# Patient Record
Sex: Male | Born: 1955 | Race: White | Hispanic: No | Marital: Married | Smoking: Never smoker
Health system: Southern US, Community
[De-identification: ages and names within clinical notes are randomized; demographics above are authoritative.]

## PROBLEM LIST (undated history)

## (undated) DIAGNOSIS — E785 Hyperlipidemia, unspecified: Secondary | ICD-10-CM

## (undated) DIAGNOSIS — I10 Essential (primary) hypertension: Secondary | ICD-10-CM

## (undated) DIAGNOSIS — I8390 Asymptomatic varicose veins of unspecified lower extremity: Secondary | ICD-10-CM

## (undated) HISTORY — PX: VARICOSE VEIN SURGERY: SHX832

## (undated) HISTORY — DX: Asymptomatic varicose veins of unspecified lower extremity: I83.90

## (undated) HISTORY — DX: Hyperlipidemia, unspecified: E78.5

## (undated) HISTORY — DX: Essential (primary) hypertension: I10

## (undated) HISTORY — PX: OTHER SURGICAL HISTORY: SHX169

---

## 2012-07-25 ENCOUNTER — Other Ambulatory Visit (HOSPITAL_COMMUNITY): Payer: Self-pay | Admitting: Internal Medicine

## 2012-07-25 ENCOUNTER — Ambulatory Visit (HOSPITAL_COMMUNITY)
Admission: RE | Admit: 2012-07-25 | Discharge: 2012-07-25 | Disposition: A | Discharge status: Home / Self Care | Payer: BC Managed Care – PPO | Source: Ambulatory Visit | Attending: Internal Medicine | Admitting: Internal Medicine

## 2012-07-25 DIAGNOSIS — I82819 Embolism and thrombosis of superficial veins of unspecified lower extremities: Secondary | ICD-10-CM | POA: Insufficient documentation

## 2012-07-25 DIAGNOSIS — I8310 Varicose veins of unspecified lower extremity with inflammation: Secondary | ICD-10-CM

## 2012-07-25 DIAGNOSIS — M79609 Pain in unspecified limb: Secondary | ICD-10-CM

## 2012-08-01 ENCOUNTER — Other Ambulatory Visit: Payer: Self-pay | Admitting: *Deleted

## 2012-08-01 DIAGNOSIS — I83893 Varicose veins of bilateral lower extremities with other complications: Secondary | ICD-10-CM

## 2012-08-09 ENCOUNTER — Encounter: Payer: Self-pay | Admitting: Vascular Surgery

## 2012-08-12 ENCOUNTER — Encounter (INDEPENDENT_AMBULATORY_CARE_PROVIDER_SITE_OTHER): Payer: BC Managed Care – PPO | Admitting: *Deleted

## 2012-08-12 ENCOUNTER — Ambulatory Visit (INDEPENDENT_AMBULATORY_CARE_PROVIDER_SITE_OTHER): Payer: BC Managed Care – PPO | Admitting: Vascular Surgery

## 2012-08-12 ENCOUNTER — Encounter: Payer: Self-pay | Admitting: Vascular Surgery

## 2012-08-12 VITALS — BP 160/98 | HR 61 | Resp 20 | Ht 69.0 in | Wt 180.0 lb

## 2012-08-12 DIAGNOSIS — I83893 Varicose veins of bilateral lower extremities with other complications: Secondary | ICD-10-CM | POA: Insufficient documentation

## 2012-08-12 DIAGNOSIS — I8000 Phlebitis and thrombophlebitis of superficial vessels of unspecified lower extremity: Secondary | ICD-10-CM | POA: Insufficient documentation

## 2012-08-12 NOTE — Progress Notes (Signed)
Subjective:     Patient ID: Miguel Lyons, male   DOB: Sep 19, 1955, 56 y.o.   MRN: [id number]  HPI this  55 year old male was referred for severe venous insufficiency of both lower extremities and a recent onset of thrombophlebitis in the right leg. Patient had distal stripping of the right GSB performed in 1994 in [place]. He developed recurrent varicosities shortly thereafter. In the late 1990s he episode of thrombophlebitis in the right pretibial region. 2 months ago he had another episode of thrombophlebitis in the medial calf on the right and shortly thereafter an episode of thrombophlebitis in the distal thigh on the right. He has not been treated with anticoagulants. He has no history of DVT. 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 He has no history of stasis ulcers or bleeding. He did wear a long leg elastic compression stockings for her 5 year period with some improvement in symptoms but no improvement in the varicosities but this was many years ago. 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 This is affecting his ability to work and is everyday living.  Past Medical History  Diagnosis Date  . Varicose veins     History  Substance Use Topics  . Smoking status: Former Smoker    Types: Cigarettes    Quit date: 08/13/1991  . Smokeless tobacco: Never Used  . Alcohol Use: Yes    Family History  Problem Relation Age of Onset  . Other Mother     varicose veins  . Cancer Father     No Known Allergies  Current outpatient prescriptions:aspirin 81 MG tablet, Take 81 mg by mouth daily., Disp: , Rfl:   BP 160/98  Pulse 61  Resp 20  Ht 5\' 9"  (1.753 m)  Wt 180 lb (81.647 kg)  BMI 26.58 kg/m2  Body mass index is 26.58 kg/(m^2).        Review of Systems denies chest pain, dyspnea on exertion, PND, orthopnea, claudication,  wheezing,-all systems are negative and complete review of    Objective:   Physical Exam blood pressure 160/98 heart rate 61 respirations 20 Gen.-alert and oriented x3 in no apparent distress HEENT normal for age Lungs no rhonchi or wheezing Cardiovascular regular rhythm no murmurs carotid pulses 3+ palpable no bruits audible Abdomen soft nontender no palpable masses Musculoskeletal free of  major deformities Skin clear -no rashes Neurologic normal Lower extremities 3+ femoral and dorsalis pedis pulses palpable bilaterally with 1+ edema bilaterally Right leg with 3 episodes of thrombophlebitis. Distal thigh has a large varix which is thrombosed measuring 3-4 cm in diameter. There is also an area in the medial calf over the great saphenous system measuring about 1-1/2-2 cm in diameter with thrombus. There is old chronic area of thrombophlebitis in the right pretibial region just below the knee over a 3-4 cm diameter. In addition there are multiple bulging varicosities throughout the great saphenous system and small saphenous system on the right extending down into the ankle around the medial malleolus with early hyperpigmentation. Left leg has extensive bulging varicosities beginning in the proximal anterior thigh extending down into the distal thigh and medial calf and into the medial malleolar area with early hyperpigmentation but no active ulcers.  Today I ordered bilateral venous duplex exam which I reviewed and interpreted. Right leg has gross reflux throughout right great saphenous system from the saphenofemoral junction to the  distal thigh and right small saphenous system both supplying these bulging varicosities. Left leg has gross reflux throughout great saphenous system an anterior accessory branch of left great saphenous system supplying these bulging varicosities. X line there is no DVT in either leg.       Assessment:     Severe bilateral venous insufficiency with gross reflux in  multiple superficial saphenous veins-right great and small and left great and anterior accessory branch of left great saphenous vein Multiple episodes of thrombophlebitis right leg secondary to above Pain and swelling in both lower extremities affecting the patient's ability to work and daily living    Plan:     #1 long-leg elastic compression stockings 20-30 mm gradient #2 elevate legs as much as job will allow #3 ibuprofen on a daily basis #4 if patient does not have dramatic change he needs #1 laser ablation right great saphenous vein followed by #2 laser ablation right small saphenous vein with greater than 20 stab phlebectomy followed by #3 laser ablation of left great saphenous and anterior accessory branch left great saphenous with greater than 20 stab phlebectomy as single procedure   patient will return in 3 months for further follow

## 2012-08-26 ENCOUNTER — Encounter: Payer: BC Managed Care – PPO | Admitting: Vascular Surgery

## 2012-11-08 ENCOUNTER — Encounter: Payer: Self-pay | Admitting: Vascular Surgery

## 2012-11-11 ENCOUNTER — Ambulatory Visit (INDEPENDENT_AMBULATORY_CARE_PROVIDER_SITE_OTHER): Payer: BC Managed Care – PPO | Admitting: Vascular Surgery

## 2012-11-11 ENCOUNTER — Encounter: Payer: Self-pay | Admitting: Vascular Surgery

## 2012-11-11 VITALS — BP 150/87 | HR 64 | Resp 20 | Ht 69.5 in | Wt 184.0 lb

## 2012-11-11 DIAGNOSIS — I83893 Varicose veins of bilateral lower extremities with other complications: Secondary | ICD-10-CM

## 2012-11-11 NOTE — Progress Notes (Signed)
Subjective:     Patient ID: Miguel Lyons, male   DOB: 1955/11/02, 57 y.o.   MRN: [id number]  HPI this 57 year old male returns for continued followup regarding severe varicose veins both lower extremities. He has a history thrombophlebitis in the right lower extremity on 3 occasions. He had a vein stripping done in the 1990s on the right leg and shortly thereafter began developing recurrent varicosities. He has pain aching throbbing and burning discomfort which is not relieved by long-leg elastic compression stockings 20-30 mm gradient, elevation, and ibuprofen.  Past Medical History  Diagnosis Date  . Varicose veins     History  Substance Use Topics  . Smoking status: Former Smoker    Types: Cigarettes    Quit date: 08/13/1991  . Smokeless tobacco: Never Used  . Alcohol Use: Yes    Family History  Problem Relation Age of Onset  . Other Mother     varicose veins  . Cancer Father     No Known Allergies  Current outpatient prescriptions:aspirin 81 MG tablet, Take 81 mg by mouth daily., Disp: , Rfl:   BP 150/87  Pulse 64  Resp 20  Ht 5' 9.5" (1.765 m)  Wt 184 lb (83.462 kg)  BMI 26.79 kg/m2  Body mass index is 26.79 kg/(m^2).         Review of Systems denies chest pain, dyspnea on exertion, PND, orthopnea, hemoptysis, claudication.    Objective:   Physical Exam blood pressure 150/87 heart rate 64 respirations 20 General well-developed well-nourished male in no apparent stress alert and oriented x3 Lungs no rhonchi or wheezing Right lower extremity with extensive large bulging varicosities beginning in the medial thigh extending into the medial and posterior calf with 1+ distal edema and some old thrombophlebitis in the right pretibial region. Left leg has bulging varicosities beginning in the proximal third of the thigh extending from the anterior excess Roux branch down the anterior thigh into the pretibial region with 1+ edema. Both legs have 2+ dorsalis pedis  pulses palpable.    Assessment:     Severe symptomatic large bulging varicose veins both lower extremities with history thrombophlebitis right leg on 3 occasions-symptoms not relieved by conservative measures-affecting patient's daily living Patient has documented gross reflux in right great saphenous vein, right small saphenous vein supplying these bulging varicosities. On the left leg patient has gross reflux in the great saphenous and anterior accessory branch of the saphenous which is supplying these large symptomatic varicosities in the thigh and pretibial region To alleviate the problem the left the patient will need laser ablation of both the great saphenous and anterior excess rib branch plus greater than 20 stab phlebectomy    Plan:    patient needs #1 laser ablation right great saphenous vein followed by #2 laser ablation right small saphenous vein plus gradient and 20 stab phlebectomy #3 laser ablation of left great saphenous vein and anterior accessory saphenous vein both of which have reflux in our supplying bulging varicosities. It should be done as a single procedure accompanied by greater than 20 stab phlebectomy. Laser ablation on the left only the great saphenous vein without treating the anterior excess Roux branch will not relieve the problem since the patient has bulging varicosities supplied by both  We'll proceed with precertification to perform this in the near future

## 2012-11-20 ENCOUNTER — Other Ambulatory Visit: Payer: Self-pay | Admitting: *Deleted

## 2012-11-20 DIAGNOSIS — I83893 Varicose veins of bilateral lower extremities with other complications: Secondary | ICD-10-CM

## 2012-12-06 ENCOUNTER — Encounter: Payer: Self-pay | Admitting: Vascular Surgery

## 2012-12-09 ENCOUNTER — Encounter: Payer: Self-pay | Admitting: Vascular Surgery

## 2012-12-09 ENCOUNTER — Ambulatory Visit (INDEPENDENT_AMBULATORY_CARE_PROVIDER_SITE_OTHER): Payer: BC Managed Care – PPO | Admitting: Vascular Surgery

## 2012-12-09 VITALS — BP 146/95 | HR 65 | Resp 16 | Ht 69.5 in | Wt 180.0 lb

## 2012-12-09 DIAGNOSIS — I83893 Varicose veins of bilateral lower extremities with other complications: Secondary | ICD-10-CM

## 2012-12-09 NOTE — Progress Notes (Signed)
Laser Ablation Procedure      Date: 12/09/2012    Miguel Lyons DOB:[birth year]  Consent signed: Yes  Surgeon:J.D. Hart [place]  Procedure: Laser Ablation: right Greater Saphenous Vein  BP 146/95  Pulse 65  Resp 16  Ht 5' 9.5" (1.765 m)  Wt 180 lb (81.647 kg)  BMI 26.21 kg/m2  Start time: 9:30   End time: 9:50  Tumescent Anesthesia: 400 cc 0.9% NaCl with 50 cc Lidocaine HCL with 1% Epi and 15 cc 8.4% NaHCO3  Local Anesthesia: 6 cc Lidocaine HCL and NaHCO3 (ratio 2:1)  Pulsed mode:yes Watts 15 Seconds 1 Pulses:1 Total Pulses:116 Total Energy: 1740 Total Time: 1:56     Patient tolerated procedure well: Yes  Notes:   Description of Procedure:  After marking the course of the saphenous vein and the secondary varicosities in the standing position, the patient was placed on the operating table in the supine position, and the right leg was prepped and draped in sterile fashion. Local anesthetic was administered, and under ultrasound guidance the saphenous vein was accessed with a micro needle and guide wire; then the micro puncture sheath was placed. A guide wire was inserted to the saphenofemoral junction, followed by a 5 french sheath.  The position of the sheath and then the laser fiber below the junction was confirmed using the ultrasound and visualization of the aiming beam.  Tumescent anesthesia was administered along the course of the saphenous vein using ultrasound guidance. Protective laser glasses were placed on the patient, and the laser was fired at 15 watt pulsed mode advancing 1-2 mm per sec.  For a total of 1740 joules.  A steri strip was applied to the puncture site.    ABD pads and thigh high compression stockings were applied.  Ace wrap bandages were applied over the phlebectomy sites and at the top of the saphenofemoral junction.  Blood loss was less than 15 cc.  The patient ambulated out of the operating room having tolerated the procedure well.

## 2012-12-09 NOTE — Progress Notes (Signed)
Subjective:     Patient ID: Miguel Lyons, male   DOB: [birth year], 57 y.o.   MRN: [id number]  HPI this 57 year old male laser ablation right great saphenous vein performed under local tumescent anesthesia for venous hypertension with painful varicosities. He tolerated the procedure well. Total of 1740 J of energy was utilized.   Review of Systems     Objective:   Physical Exam BP 146/95  Pulse 65  Resp 16  Ht 5' 9.5" (1.765 m)  Wt 180 lb (81.647 kg)  BMI 26.21 kg/m2        Assessment:     Well-tolerated laser ablation right great saphenous vein performed under local tumescent anesthesia    Plan:     Return in one week for venous duplex exam to confirm closure right great saphenous vein

## 2012-12-10 ENCOUNTER — Telehealth: Payer: Self-pay | Admitting: *Deleted

## 2012-12-10 NOTE — Telephone Encounter (Signed)
Is having discomfort but following all instructions. Reminded him of his fu appt.

## 2012-12-13 ENCOUNTER — Encounter: Payer: Self-pay | Admitting: Vascular Surgery

## 2012-12-16 ENCOUNTER — Ambulatory Visit (INDEPENDENT_AMBULATORY_CARE_PROVIDER_SITE_OTHER): Payer: BC Managed Care – PPO | Admitting: Vascular Surgery

## 2012-12-16 ENCOUNTER — Encounter (INDEPENDENT_AMBULATORY_CARE_PROVIDER_SITE_OTHER): Payer: BC Managed Care – PPO | Admitting: *Deleted

## 2012-12-16 ENCOUNTER — Encounter: Payer: Self-pay | Admitting: Vascular Surgery

## 2012-12-16 ENCOUNTER — Other Ambulatory Visit: Payer: Self-pay | Admitting: *Deleted

## 2012-12-16 VITALS — BP 159/98 | HR 54 | Resp 18 | Ht 69.5 in | Wt 180.0 lb

## 2012-12-16 DIAGNOSIS — I83893 Varicose veins of bilateral lower extremities with other complications: Secondary | ICD-10-CM

## 2012-12-16 NOTE — Progress Notes (Signed)
Subjective:     Patient ID: Miguel Lyons, male   DOB: 12/12/1955, 57 y.o.   MRN: [id number]  HPI this 57 year old male returns 1 week post laser ablation right great saphenous vein for pain varicosities. He tolerated the procedure well. He states that he has had decrease in the ankle swelling. He has had chest pain hemoptysis or dyspnea on exertion. Left leg is unchanged continuing to have aching throbbing and burning discomfort despite long-leg elastic compression stockings.  Past Medical History  Diagnosis Date  . Varicose veins     History  Substance Use Topics  . Smoking status: Former Smoker    Types: Cigarettes    Quit date: 08/13/1991  . Smokeless tobacco: Never Used  . Alcohol Use: Yes    Family History  Problem Relation Age of Onset  . Other Mother     varicose veins  . Cancer Father     No Known Allergies  Current outpatient prescriptions:aspirin 81 MG tablet, Take 81 mg by mouth daily., Disp: , Rfl:   BP 159/98  Pulse 54  Resp 18  Ht 5' 9.5" (1.765 m)  Wt 180 lb (81.647 kg)  BMI 26.21 kg/m2  Body mass index is 26.21 kg/(m^2).        Review of Systems see history of present illness  no chest pain, dyspnea on exertion, PND, orthopnea    Objective:   Physical Exam blood pressure 20/200 98 heart rate 54 respirations 18 General well-developed well-nourished male in no apparent stress alert and oriented x3 Right leg with mild tenderness along course of great saphenous vein from saphenofemoral junction to distal thigh. 1+ distal edema. 3 posterior cells pedis pulse palpable. Lungs no rhonchi or wheezing  To have ordered a venous duplex exam of the right leg which I reviewed and interpreted. There is no DVT. His total closure of the right great saphenous vein from the distal thigh up to the saphenofemoral junction.     Assessment:     Successful laser ablation right great saphenous vein    Plan:    Return next week for laser ablation right small  saphenous vein with greater than 20 stab phlebectomy

## 2012-12-20 ENCOUNTER — Encounter: Payer: Self-pay | Admitting: Vascular Surgery

## 2012-12-23 ENCOUNTER — Encounter: Payer: Self-pay | Admitting: Vascular Surgery

## 2012-12-23 ENCOUNTER — Ambulatory Visit (INDEPENDENT_AMBULATORY_CARE_PROVIDER_SITE_OTHER): Payer: BC Managed Care – PPO | Admitting: Vascular Surgery

## 2012-12-23 VITALS — BP 151/96 | HR 57 | Resp 16 | Ht 70.0 in | Wt 180.0 lb

## 2012-12-23 DIAGNOSIS — I83893 Varicose veins of bilateral lower extremities with other complications: Secondary | ICD-10-CM

## 2012-12-23 NOTE — Progress Notes (Addendum)
Laser Ablation Procedure      Date: 12/23/2012    Miguel Lyons DOB:1955-12-22  Consent signed: Yes  Surgeon:J.D. Hart [place]  Procedure: Laser Ablation: right Small Saphenous Vein  BP 151/96  Pulse 57  Resp 16  Ht 5\' 10"  (1.778 m)  Wt 180 lb (81.647 kg)  BMI 25.83 kg/m2  Start time: 9:00   End time: 10:20  Tumescent Anesthesia: 480 cc 0.9% NaCl with 50 cc Lidocaine HCL with 1% Epi and 15 cc 8.4% NaHCO3  Local Anesthesia: 13 cc Lidocaine HCL and NaHCO3 (ratio 2:1)  Pulsed mode:yes Watts 15 Seconds 1 Pulses:1 Total Pulses:78 Total Energy: 1162 Total Time: 1:17     Stab Phlebectomy: >20 Sites: Calf  Patient tolerated procedure well: Yes  Notes:   Description of Procedure:  After marking the course of the saphenous vein and the secondary varicosities in the standing position, the patient was placed on the operating table in the prone position, and the right leg was prepped and draped in sterile fashion. Local anesthetic was administered, and under ultrasound guidance the saphenous vein was accessed with a micro needle and guide wire; then the micro puncture sheath was placed. A guide wire was inserted to the saphenopopliteal junction, followed by a 5 french sheath.  The position of the sheath and then the laser fiber below the junction was confirmed using the ultrasound and visualization of the aiming beam.  Tumescent anesthesia was administered along the course of the saphenous vein using ultrasound guidance. Protective laser glasses were placed on the patient, and the laser was fired at 15 watt pulsed mode advancing 1-2 mm per sec.  For a total of 1162 joules.  A steri strip was applied to the puncture site.  The patient was then put into Trendelenburg position.  Local anesthetic was utilized overlying the marked varicosities.  Greater than 20 stab wounds were made using the tip of an 11 blade; and using the vein hook,  The phlebectomies were performed using a hemostat to avulse these  varicosities.  Adequate hemostasis was achieved, and steri strips were applied to the stab wound.      ABD pads and thigh high compression stockings were applied.  Ace wrap bandages were applied over the phlebectomy sites and at the top of the saphenopopliteal junction.  Blood loss was less than 15 cc.  The patient ambulated out of the operating room having tolerated the procedure well.

## 2012-12-23 NOTE — Progress Notes (Signed)
Subjective:     Patient ID: Miguel Lyons, male   DOB: March 05, 1956, 57 y.o.   MRN: [id number]  HPI oil and laser ablation of the right small saphenous vein with greater than 20 stab phlebectomy-approximately 30-performed under local tumescent anesthesia for painful varicosities. A total of 1162 J of energy was utilized. The patient tolerated the procedure well.   Review of Systems     Objective:   Physical Exam BP 151/96  Pulse 57  Resp 16  Ht 5\' 10"  (1.778 m)  Wt 180 lb (81.647 kg)  BMI 25.83 kg/m2       Assessment:     Well-tolerated laser ablation right small saphenous vein with approximately 30 stab phlebectomy of painful varicosities performed under local tumescent anesthesia    Plan:     Return next week for a venous duplex exam to confirm closure right small saphenous vein. We'll then schedule for left great saphenous vein laser ablation to be performed near future

## 2012-12-24 ENCOUNTER — Telehealth: Payer: Self-pay | Admitting: *Deleted

## 2012-12-24 NOTE — Telephone Encounter (Signed)
Pt doing well. No problems or bleeding. Following all instructions. Reminded him of his fu appt.

## 2012-12-25 ENCOUNTER — Encounter: Payer: Self-pay | Admitting: Vascular Surgery

## 2012-12-30 ENCOUNTER — Encounter: Payer: Self-pay | Admitting: Vascular Surgery

## 2012-12-31 ENCOUNTER — Encounter (INDEPENDENT_AMBULATORY_CARE_PROVIDER_SITE_OTHER): Payer: BC Managed Care – PPO | Admitting: *Deleted

## 2012-12-31 ENCOUNTER — Ambulatory Visit (INDEPENDENT_AMBULATORY_CARE_PROVIDER_SITE_OTHER): Payer: BC Managed Care – PPO | Admitting: Vascular Surgery

## 2012-12-31 ENCOUNTER — Encounter: Payer: Self-pay | Admitting: Vascular Surgery

## 2012-12-31 VITALS — BP 144/81 | HR 61 | Resp 16 | Ht 69.5 in | Wt 180.0 lb

## 2012-12-31 DIAGNOSIS — Z48812 Encounter for surgical aftercare following surgery on the circulatory system: Secondary | ICD-10-CM

## 2012-12-31 DIAGNOSIS — I83893 Varicose veins of bilateral lower extremities with other complications: Secondary | ICD-10-CM

## 2012-12-31 NOTE — Progress Notes (Signed)
Subjective:     Patient ID: Miguel Lyons, male   DOB: 1956-08-02, 57 y.o.   MRN: [id number]  HPI this 57 year old male returns 1 week post laser ablation of right small saphenous vein with greater than 20 stab phlebectomy of painful varicosities. He previously had laser ablation of the right great saphenous vein. He has worn elastic stockings. He has had mild to moderate discomfort associated with this procedure. He has noticed decrease swelling distally.  Review of Systems     Objective:   Physical Exam BP 144/81  Pulse 61  Resp 16  Ht 5' 9.5" (1.765 m)  Wt 180 lb (81.647 kg)  BMI 26.21 kg/m2  General well-developed well-nourished male in no apparent stress alert and oriented x3 Lungs no rhonchi or wheezing Right leg with mild tenderness along course of small saphenous vein. Stab phlebectomy sites are all healing nicely. No distal edema noted.  Today I ordered a venous duplex exam of the right leg which I reviewed and interpreted. Both the great and small saphenous vein had been successfully closed. There is no DVT.      Assessment:     Successful laser ablation right great and small saphenous vein with multiple stab phlebectomy for painful varicosities    Plan:     Plan laser ablation left great saphenous vein with greater than 20 stab phlebectomy on June 2. Patient will then have to have followup ultrasound in 3 months to see if anterior accessory branch will need closure.

## 2013-01-07 ENCOUNTER — Encounter: Payer: Self-pay | Admitting: Vascular Surgery

## 2013-01-10 ENCOUNTER — Encounter: Payer: Self-pay | Admitting: Vascular Surgery

## 2013-01-13 ENCOUNTER — Ambulatory Visit (INDEPENDENT_AMBULATORY_CARE_PROVIDER_SITE_OTHER): Payer: BC Managed Care – PPO | Admitting: Vascular Surgery

## 2013-01-13 ENCOUNTER — Other Ambulatory Visit: Payer: BC Managed Care – PPO | Admitting: Vascular Surgery

## 2013-01-13 ENCOUNTER — Encounter: Payer: Self-pay | Admitting: Vascular Surgery

## 2013-01-13 VITALS — BP 183/110 | HR 63 | Resp 18 | Ht 69.5 in | Wt 178.0 lb

## 2013-01-13 DIAGNOSIS — I83893 Varicose veins of bilateral lower extremities with other complications: Secondary | ICD-10-CM

## 2013-01-13 NOTE — Progress Notes (Signed)
Laser Ablation Procedure      Date: 01/13/2013    Miguel Lyons DOB:Sep 10, 1955  Consent signed: Yes  Surgeon:J.D. Hart [place]  Procedure: Laser Ablation: left Greater Saphenous Vein  BP 183/110  Pulse 63  Resp 18  Ht 5' 9.5" (1.765 m)  Wt 178 lb (80.74 kg)  BMI 25.92 kg/m2  Start time: 9:00   End time: 9:30  Tumescent Anesthesia: 300 cc 0.9% NaCl with 50 cc Lidocaine HCL with 1% Epi and 15 cc 8.4% NaHCO3  Local Anesthesia: 3 cc Lidocaine HCL and NaHCO3 (ratio 2:1)  Pulsed mode:yes Watts 15 Seconds 1 Pulses:1 Total Pulses:110 Total Energy: 1647 Total Time: 1:49     Patient tolerated procedure well: Yes  Notes:   Description of Procedure:  After marking the course of the saphenous vein and the secondary varicosities in the standing position, the patient was placed on the operating table in the supine position, and the left leg was prepped and draped in sterile fashion. Local anesthetic was administered, and under ultrasound guidance the saphenous vein was accessed with a micro needle and guide wire; then the micro puncture sheath was placed. A guide wire was inserted to the saphenofemoral junction, followed by a 5 french sheath.  The position of the sheath and then the laser fiber below the junction was confirmed using the ultrasound and visualization of the aiming beam.  Tumescent anesthesia was administered along the course of the saphenous vein using ultrasound guidance. Protective laser glasses were placed on the patient, and the laser was fired at 15 watt pulsed mode advancing 1-2 mm per sec.  For a total of 1647 joules.  A steri strip was applied to the puncture site.     ABD pads and thigh high compression stockings were applied.  Ace wrap bandages were applied over the phlebectomy sites and at the top of the saphenofemoral junction.  Blood loss was less than 15 cc.  The patient ambulated out of the operating room having tolerated the procedure well.

## 2013-01-13 NOTE — Progress Notes (Signed)
Subjective:     Patient ID: Miguel Lyons, male   DOB: June 07, 1956, 57 y.o.   MRN: [id number]  HPI this 57 year old male had laser ablation left great saphenous vein performed under local tumescent anesthesia. Total of 1647 J of energy was utilized. He tolerated the procedure well.  Review of Systems     Objective:   Physical Exam BP 183/110  Pulse 63  Resp 18  Ht 5' 9.5" (1.765 m)  Wt 178 lb (80.74 kg)  BMI 25.92 kg/m2      Assessment:     Well-tolerated laser ablation left great saphenous vein performed under local tumescent anesthesia    Plan:     Return in one week to confirm closure left great saphenous vein Patient will then continue wearing the elastic compression stockings and trying elevation and ibuprofen for 3 months and will return to further assess anterior accessory branch left great saphenous vein which does have gross reflux applying these bulging varicosities

## 2013-01-14 ENCOUNTER — Encounter: Payer: Self-pay | Admitting: Vascular Surgery

## 2013-01-14 ENCOUNTER — Telehealth: Payer: Self-pay | Admitting: *Deleted

## 2013-01-14 NOTE — Telephone Encounter (Signed)
Pt doing well. No problems except the other leg has pain in the calf. Told him to put heat on the calf when he's icing the leg we worked on [employer]. Following instructions. Reminded him of his visit next week.

## 2013-01-20 ENCOUNTER — Ambulatory Visit: Payer: BC Managed Care – PPO | Admitting: Vascular Surgery

## 2013-01-22 ENCOUNTER — Encounter: Payer: Self-pay | Admitting: Vascular Surgery

## 2013-01-23 ENCOUNTER — Encounter: Payer: Self-pay | Admitting: Vascular Surgery

## 2013-01-23 ENCOUNTER — Ambulatory Visit (INDEPENDENT_AMBULATORY_CARE_PROVIDER_SITE_OTHER): Payer: BC Managed Care – PPO | Admitting: Vascular Surgery

## 2013-01-23 ENCOUNTER — Encounter (INDEPENDENT_AMBULATORY_CARE_PROVIDER_SITE_OTHER): Payer: BC Managed Care – PPO | Admitting: *Deleted

## 2013-01-23 VITALS — BP 138/89 | HR 84 | Resp 18 | Ht 69.5 in | Wt 178.0 lb

## 2013-01-23 DIAGNOSIS — I83893 Varicose veins of bilateral lower extremities with other complications: Secondary | ICD-10-CM

## 2013-01-23 DIAGNOSIS — Z48812 Encounter for surgical aftercare following surgery on the circulatory system: Secondary | ICD-10-CM

## 2013-01-23 NOTE — Progress Notes (Signed)
Subjective:     Patient ID: Miguel Lyons, male   DOB: August 24, 1955, 57 y.o.   MRN: [id number]  HPI this 57 year old male returns one week post laser ablation left great saphenous vein for venous hypertension with bulging varicosities he has had mild to moderate discomfort along the course of the great saphenous vein from the knee to the groin area. He has had no change in the distal edema. He's had no change in the bulging varicosities in the anterior thigh and pretibial region. He has worn has elastic compression stockings.  Past Medical History  Diagnosis Date  . Varicose veins     History  Substance Use Topics  . Smoking status: Former Smoker    Types: Cigarettes    Quit date: 08/13/1991  . Smokeless tobacco: Never Used  . Alcohol Use: Yes    Family History  Problem Relation Age of Onset  . Other Mother     varicose veins  . Cancer Father     No Known Allergies  Current outpatient prescriptions:aspirin 81 MG tablet, Take 81 mg by mouth daily., Disp: , Rfl:   BP 138/89  Pulse 84  Resp 18  Ht 5' 9.5" (1.765 m)  Wt 178 lb (80.74 kg)  BMI 25.92 kg/m2  Body mass index is 25.92 kg/(m^2).           Review of Systems denies chest pain, dyspnea on exertion, PND, orthopnea, hemoptysis, claudication to     Objective:   Physical Exam blood pressure 130/89 heart rate 84 respirations 18 General well-developed well-nourished male in no apparent distress alert and oriented x3  lungs no rhonchi or wheezing Left leg with mild discomfort along course of the great saphenous vein from knee to the saphenofemoral junction. With the bulging varicosities. 3+ dorsalis pedis pulse palpable   today I ordered a venous duplex exam the left leg which I reviewed and interpreted. There is no DVT. Saphenous vein is totally closed from distal thigh to near the saphenofemoral junction.osities and anterior thigh pretibial region remained  Successful laser ablation left great saphenous vein  with residual bulging varicosities likely due to reflux and left anterior accessory branch of GSV     return in 3 months for venous duplex exam left leg to determine if laser ablation of anterior accessory branch left great saphenous vein with greater than 20 stab lobectomy will be indicated      Assessment:         Plan:     Return in 3 months for venous duplex exam left leg to look at anterior accessory branch reflux

## 2013-01-30 ENCOUNTER — Other Ambulatory Visit: Payer: Self-pay | Admitting: *Deleted

## 2013-01-30 DIAGNOSIS — I83893 Varicose veins of bilateral lower extremities with other complications: Secondary | ICD-10-CM

## 2013-04-03 ENCOUNTER — Ambulatory Visit (INDEPENDENT_AMBULATORY_CARE_PROVIDER_SITE_OTHER): Payer: BC Managed Care – PPO | Admitting: Internal Medicine

## 2013-04-03 ENCOUNTER — Encounter: Payer: Self-pay | Admitting: Internal Medicine

## 2013-04-03 VITALS — BP 120/72 | HR 57 | Ht 69.5 in | Wt 183.5 lb

## 2013-04-03 DIAGNOSIS — I10 Essential (primary) hypertension: Secondary | ICD-10-CM

## 2013-04-03 DIAGNOSIS — I83893 Varicose veins of bilateral lower extremities with other complications: Secondary | ICD-10-CM

## 2013-04-03 DIAGNOSIS — R079 Chest pain, unspecified: Secondary | ICD-10-CM

## 2013-04-03 DIAGNOSIS — E785 Hyperlipidemia, unspecified: Secondary | ICD-10-CM

## 2013-04-03 NOTE — Patient Instructions (Addendum)
Your physician recommends that you schedule a follow-up appointment as needed  

## 2013-04-05 ENCOUNTER — Encounter: Payer: Self-pay | Admitting: Internal Medicine

## 2013-04-05 DIAGNOSIS — I10 Essential (primary) hypertension: Secondary | ICD-10-CM | POA: Insufficient documentation

## 2013-04-05 DIAGNOSIS — E785 Hyperlipidemia, unspecified: Secondary | ICD-10-CM | POA: Insufficient documentation

## 2013-04-05 DIAGNOSIS — R079 Chest pain, unspecified: Secondary | ICD-10-CM | POA: Insufficient documentation

## 2013-04-05 NOTE — Progress Notes (Signed)
OFFICE NOTE  Chief Complaint:  New patient, chest pain, varicose veins, HTN  Primary Care Physician: Kirk Ruths, MD  HPI:  Miguel Lyons is a pleasant 57 year old gentleman with a history of hypertension. Recently his blood pressure had been running higher and he was started on losartan 50 mg daily. He's had marked improvement in his blood pressure on this medication and actually is very well controlled today. He also reported some intermittent chest pain. The chest pain was described as sharp and he can point directly to the area. It seemed to be in between the ribs and was worse with taking a deep breath or turning. Was not necessarily associated by exertion or exercise. He also has a history of abnormal cholesterol and was recently found to have a total cholesterol 258, triglycerides 1:30, HDL 59, and LDL 173. An EKG showed mild T-wave abnormalities from Dr. Edison Simon office, however are EKG today shows normal sinus rhythm without any ischemic changes. He denies any further chest pain at this time.  He does reports a history of varicose veins and superficial phlebitis, but he is a patient of Dr. Hart Rochester and has been followed at his office for this.   PMHx:  Past Medical History  Diagnosis Date  . Varicose veins   . Hypertension   . Hyperlipidemia     Past Surgical History  Procedure Laterality Date  . Varicose veins    . Varicose vein surgery      FAMHx:  Family History  Problem Relation Age of Onset  . Other Mother     varicose veins  . Cancer Father     SOCHx:   reports that he quit smoking about 21 years ago. His smoking use included Cigarettes. He smoked 0.00 packs per day. He has never used smokeless tobacco. He reports that  drinks alcohol. He reports that he does not use illicit drugs.  ALLERGIES:  No Known Allergies  ROS: A comprehensive review of systems was negative except for: Cardiovascular: positive for chest pain and HTN  HOME MEDS: Current  Outpatient Prescriptions  Medication Sig Dispense Refill  . aspirin 81 MG tablet Take 81 mg by mouth daily.      Marland Kitchen losartan (COZAAR) 50 MG tablet Take 50 mg by mouth daily.       No current facility-administered medications for this visit.    LABS/IMAGING: No results found for this or any previous visit (from the past 48 hour(s)). No results found.  VITALS: BP 120/72  Pulse 57  Ht 5' 9.5" (1.765 m)  Wt 183 lb 8 oz (83.235 kg)  BMI 26.72 kg/m2  EXAM: General appearance: alert and no distress Neck: no adenopathy, no carotid bruit, no JVD, supple, symmetrical, trachea midline and thyroid not enlarged, symmetric, no tenderness/mass/nodules Lungs: clear to auscultation bilaterally Heart: regular rate and rhythm, S1, S2 normal, no murmur, click, rub or gallop Abdomen: soft, non-tender; bowel sounds normal; no masses,  no organomegaly Extremities: extremities normal, atraumatic, no cyanosis or edema and varicose veins noted Pulses: 2+ and symmetric Skin: Skin color, texture, turgor normal. No rashes or lesions Neurologic: Grossly normal  EKG: Sinus bradycardia at   ASSESSMENT: 1. Atypical chest pain 2. Hypertension-well controlled on Cozaar 3. Untreated dyslipidemia 4. Varicose veins-managed by Dr. Hart Rochester  PLAN: 1.   Mr. Monarch blood pressure is now improved on Cozaar. I think this is adequate therapy and I would keep him on low dose aspirin due to his risk factors for coronary disease. His chest  pain is very atypical I don't think any further cardiac workup is necessary at this time. If he should develop more chest pain with exertion, we can see him back and perform a stress test. His cholesterol could be better treated as well. I did recommend statin therapy, but he wants to work on weight loss and exercise for at least 6 months. I think this is reasonable, but would have a low threshold for starting a statin with a goal to reduce LDL cholesterol by at least 50%.    Thank you  for consulting [place]. He can follow-up with [place] as needed.  Chrystie Nose, MD, [facility] Attending Cardiologist [facility]  HILTY,Kenneth C 04/05/2013, 12:06 PM

## 2013-04-11 ENCOUNTER — Encounter: Payer: Self-pay | Admitting: Vascular Surgery

## 2013-04-15 ENCOUNTER — Ambulatory Visit (INDEPENDENT_AMBULATORY_CARE_PROVIDER_SITE_OTHER): Payer: BC Managed Care – PPO | Admitting: Vascular Surgery

## 2013-04-15 ENCOUNTER — Encounter: Payer: Self-pay | Admitting: Vascular Surgery

## 2013-04-15 ENCOUNTER — Encounter (INDEPENDENT_AMBULATORY_CARE_PROVIDER_SITE_OTHER): Payer: BC Managed Care – PPO | Admitting: *Deleted

## 2013-04-15 VITALS — BP 128/73 | HR 55 | Resp 16 | Ht 70.0 in | Wt 180.0 lb

## 2013-04-15 DIAGNOSIS — I83893 Varicose veins of bilateral lower extremities with other complications: Secondary | ICD-10-CM

## 2013-04-15 NOTE — Progress Notes (Signed)
Subjective:     Patient ID: Miguel Lyons, male   DOB: 1955-08-23, 57 y.o.   MRN: [id number]  HPI this 57 year old male returns 3 months post laser ablation left great saphenous vein for painful bulging varicosities due to reflux. He was known to also have reflux in his large anterior accessory branch of the left great saphenous vein. He has been continuing to wear long light elastic compression stockings 20-30 mm gradient and trying elevation and ibuprofen on a daily basis. He continues to have significant discomfort throughout the day and these bulging varicosities in the distal anterior thigh and calf area of chronic edema in the left ankle. This is not only affecting his daily living but his ability to work.  Past Medical History  Diagnosis Date  . Varicose veins   . Hypertension   . Hyperlipidemia     History  Substance Use Topics  . Smoking status: Former Smoker    Types: Cigarettes    Quit date: 08/13/1991  . Smokeless tobacco: Never Used  . Alcohol Use: Yes     Comment: 1 case beer/week    Family History  Problem Relation Age of Onset  . Other Mother     varicose veins  . Cancer Father     No Known Allergies  Current outpatient prescriptions:aspirin 81 MG tablet, Take 81 mg by mouth daily., Disp: , Rfl: ;  losartan (COZAAR) 50 MG tablet, Take 50 mg by mouth daily., Disp: , Rfl:   BP 128/73  Pulse 55  Resp 16  Ht 5\' 10"  (1.778 m)  Wt 180 lb (81.647 kg)  BMI 25.83 kg/m2  Body mass index is 25.83 kg/(m^2).           Review of Systems denies chest pain, dyspnea on exertion, PND, orthopnea, wheezing, hemoptysis, claudication.    Objective:   Physical Exam BP 128/73  Pulse 55  Resp 16  Ht 5\' 10"  (1.778 m)  Wt 180 lb (81.647 kg)  BMI 25.83 kg/m2  General well-developed well-nourished male no apparent stress alert and oriented x3 Left leg with large clusters of bulging varicosities beginning in the mid anterior thigh extending down into the medial calf to  the ankle and pretibial region with 1+ edema  Today I ordered a venous duplex exam of the left leg which are reviewed and interpreted. The left great saphenous vein is totally closed by previous ablation. There is no DVT. There is a large anterior accessory branch gross reflux supplying these bulging varicosities.      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 These symptoms are resistant to conservative measures including long-leg elastic compression stockings 20-30 mm gradient, elevation, and ibuprofen    Plan:     Patient needs laser ablation anterior accessory branch left great saphenous vein with greater than 20 stab phlebectomy of painful secondary varicosities Will proceed with precertification to perform this in the near future

## 2013-04-18 ENCOUNTER — Other Ambulatory Visit: Payer: Self-pay | Admitting: *Deleted

## 2013-04-18 DIAGNOSIS — I83893 Varicose veins of bilateral lower extremities with other complications: Secondary | ICD-10-CM

## 2013-04-25 ENCOUNTER — Encounter: Payer: Self-pay | Admitting: Vascular Surgery

## 2013-04-28 ENCOUNTER — Encounter: Payer: Self-pay | Admitting: Vascular Surgery

## 2013-04-28 ENCOUNTER — Ambulatory Visit (INDEPENDENT_AMBULATORY_CARE_PROVIDER_SITE_OTHER): Payer: BC Managed Care – PPO | Admitting: Vascular Surgery

## 2013-04-28 VITALS — BP 128/75 | HR 57 | Resp 16 | Ht 69.5 in | Wt 180.0 lb

## 2013-04-28 DIAGNOSIS — I83893 Varicose veins of bilateral lower extremities with other complications: Secondary | ICD-10-CM

## 2013-04-28 NOTE — Progress Notes (Signed)
Subjective:     Patient ID: Miguel Lyons, male   DOB: 1956/07/06, 57 y.o.   MRN: [id number]  HPI this 57 year old male had laser ablation of the anterior accessory branch the left great saphenous vein with approximately 30 stab phlebectomy of secondary painful varicosities performed under local tumescent anesthesia. He tolerated the procedure well. A total of 1027 J of energy was utilized.  Review of Systems     Objective:   Physical Exam BP 128/75  Pulse 57  Resp 16  Ht 5' 9.5" (1.765 m)  Wt 180 lb (81.647 kg)  BMI 26.21 kg/m2        Assessment:     Well-tolerated laser ablation anterior suture branch left great saphenous vein with greater than 20 stab phlebectomy    Plan:     Return in one week for venous duplex exam to confirm closure anterior sector branch left great saphenous vein. This will complete his treatment regimen

## 2013-04-28 NOTE — Progress Notes (Signed)
Laser Ablation Procedure      Date: 04/28/2013    Miguel Lyons DOB:[birth year]  Consent signed: Yes  Surgeon:J.D. Hart Rochester  Procedure: Laser Ablation: left anterior accessory branch of L GSV Saphenous Vein  BP 128/75  Pulse 57  Resp 16  Ht 5' 9.5" (1.765 m)  Wt 180 lb (81.647 kg)  BMI 26.21 kg/m2  Start time: 12:50   End time: 2:00  Tumescent Anesthesia: 300 cc 0.9% NaCl with 50 cc Lidocaine HCL with 1% Epi and 15 cc 8.4% NaHCO3  Local Anesthesia: 8 cc Lidocaine HCL and NaHCO3 (ratio 2:1)  Pulsed mode: 15 watts, delay, 1.0 duration Total energy: 1029, totaal pulses: 69, total time: 1:08  Stab Phlebectomy: >20 Sites: Thigh and Calf  Patient tolerated procedure well: Yes  Notes:   Description of Procedure:  After marking the course of the saphenous vein and the secondary varicosities in the standing position, the patient was placed on the operating table in the supine position, and the left leg was prepped and draped in sterile fashion. Local anesthetic was administered, and under ultrasound guidance the saphenous vein was accessed with a micro needle and guide wire; then the micro puncture sheath was placed. A guide wire was inserted to the saphenofemoral junction, followed by a 5 french sheath.  The position of the sheath and then the laser fiber below the junction was confirmed using the ultrasound and visualization of the aiming beam.  Tumescent anesthesia was administered along the course of the saphenous vein using ultrasound guidance. Protective laser glasses were placed on the patient, and the laser was fired at pulsed mode 15 watts.  For a total of 1029 joules.  A steri strip was applied to the puncture site.  The patient was then put into Trendelenburg position.  Local anesthetic was utilized overlying the marked varicosities.  Greater than 20 stab wounds were made using the tip of an 11 blade; and using the vein hook,  The phlebectomies were performed using a hemostat  to avulse these varicosities.  Adequate hemostasis was achieved, and steri strips were applied to the stab wound.      ABD pads and thigh high compression stockings were applied.  Ace wrap bandages were applied over the phlebectomy sites and at the top of the saphenofemoral junction.  Blood loss was less than 15 cc.  The patient ambulated out of the operating room having tolerated the procedure well.

## 2013-04-29 ENCOUNTER — Telehealth: Payer: Self-pay | Admitting: *Deleted

## 2013-04-29 ENCOUNTER — Encounter: Payer: Self-pay | Admitting: Vascular Surgery

## 2013-04-29 NOTE — Telephone Encounter (Signed)
Reached patient at home. Pt having some discomfort but over all doing well. No bledding. Following all instructions. Reminded him of his fu appts next week.

## 2013-05-02 ENCOUNTER — Encounter: Payer: Self-pay | Admitting: Vascular Surgery

## 2013-05-05 ENCOUNTER — Encounter: Payer: Self-pay | Admitting: Vascular Surgery

## 2013-05-05 ENCOUNTER — Ambulatory Visit (INDEPENDENT_AMBULATORY_CARE_PROVIDER_SITE_OTHER): Payer: BC Managed Care – PPO | Admitting: Vascular Surgery

## 2013-05-05 ENCOUNTER — Encounter (INDEPENDENT_AMBULATORY_CARE_PROVIDER_SITE_OTHER): Payer: BC Managed Care – PPO | Admitting: Vascular Surgery

## 2013-05-05 VITALS — BP 145/81 | HR 66 | Resp 16 | Ht 69.5 in | Wt 180.0 lb

## 2013-05-05 DIAGNOSIS — M7989 Other specified soft tissue disorders: Secondary | ICD-10-CM

## 2013-05-05 DIAGNOSIS — M79609 Pain in unspecified limb: Secondary | ICD-10-CM

## 2013-05-05 DIAGNOSIS — I83893 Varicose veins of bilateral lower extremities with other complications: Secondary | ICD-10-CM

## 2013-05-05 NOTE — Progress Notes (Signed)
Subjective:     Patient ID: Miguel Lyons, male   DOB: [birth year], 57 y.o.   MRN: [id number]  HPI this 57 year old male returns 1 week post laser ablation of the left anterior accessory branch of the great saphenous vein with greater than 20 stab phlebectomy painful varicosities. He tolerated the procedures well and has no specific complaints today. He denies any chest pain shortness of breath or dyspnea on exertion and hemoptysis. He has been wearing his elastic compression stockings and taking ibuprofen as instructed.  Past Medical History  Diagnosis Date  . Varicose veins   . Hypertension   . Hyperlipidemia     History  Substance Use Topics  . Smoking status: Former Smoker    Types: Cigarettes    Quit date: 08/13/1991  . Smokeless tobacco: Never Used  . Alcohol Use: Yes     Comment: 1 case beer/week    Family History  Problem Relation Age of Onset  . Other Mother     varicose veins  . Cancer Father     No Known Allergies  Current outpatient prescriptions:aspirin 81 MG tablet, Take 81 mg by mouth daily., Disp: , Rfl: ;  losartan (COZAAR) 50 MG tablet, Take 50 mg by mouth daily., Disp: , Rfl:   BP 145/81  Pulse 66  Resp 16  Ht 5' 9.5" (1.765 m)  Wt 180 lb (81.647 kg)  BMI 26.21 kg/m2  Body mass index is 26.21 kg/(m^2).          Review of Systems     Objective:   Physical Exam BP 145/81  Pulse 66  Resp 16  Ht 5' 9.5" (1.765 m)  Wt 180 lb (81.647 kg)  BMI 26.21 kg/m2  General alert and oriented x3 in no apparent distress Left leg with tenderness along the course of anterior accessory branch great saphenous vein. Stab phlebectomy sites healing nicely. No distal edema noted. 3+ dorsalis pedis pulse palpable.  Today I ordered a venous duplex exam of the left leg which are reviewed and interpreted. There is no DVT. There is complete closure of the anterior accessory branch of the left great saphenous vein.      Assessment:     Successful closure left  great saphenous anterior accessory branch with greater than 20 stab phlebectomy of painful varicosities    Plan:     Return to see me on when necessary basis

## 2013-09-03 ENCOUNTER — Encounter: Payer: Self-pay | Admitting: Family Medicine

## 2013-09-03 ENCOUNTER — Ambulatory Visit (INDEPENDENT_AMBULATORY_CARE_PROVIDER_SITE_OTHER): Payer: BC Managed Care – PPO | Admitting: Family Medicine

## 2013-09-03 VITALS — BP 104/74 | HR 59 | Temp 98.2°F | Resp 20 | Ht 68.0 in | Wt 181.5 lb

## 2013-09-03 DIAGNOSIS — I10 Essential (primary) hypertension: Secondary | ICD-10-CM

## 2013-09-03 DIAGNOSIS — R972 Elevated prostate specific antigen [PSA]: Secondary | ICD-10-CM

## 2013-09-03 MED ORDER — LOSARTAN POTASSIUM 50 MG PO TABS: 50.0000 mg | ORAL_TABLET | Freq: Every day | ORAL | Status: AC

## 2013-09-03 NOTE — Patient Instructions (Signed)
Fat and Cholesterol Control Diet  Fat and cholesterol levels in your blood and organs are influenced by your diet. High levels of fat and cholesterol may lead to diseases of the heart, small and large blood vessels, gallbladder, liver, and pancreas.  CONTROLLING FAT AND CHOLESTEROL WITH DIET  Although exercise and lifestyle factors are important, your diet is key. That is because certain foods are known to raise cholesterol and others to lower it. The goal is to balance foods for their effect on cholesterol and more importantly, to replace saturated and trans fat with other types of fat, such as monounsaturated fat, polyunsaturated fat, and omega-3 fatty acids.  On average, a person should consume no more than 15 to 17 g of saturated fat daily. Saturated and trans fats are considered "bad" fats, and they will raise LDL cholesterol. Saturated fats are primarily found in animal products such as meats, butter, and cream. However, that does not mean you need to give up all your favorite foods. Today, there are good tasting, low-fat, low-cholesterol substitutes for most of the things you like to eat. Choose low-fat or nonfat alternatives. Choose round or loin cuts of red meat. These types of cuts are lowest in fat and cholesterol. Chicken (without the skin), fish, veal, and ground turkey breast are great choices. Eliminate fatty meats, such as hot dogs and salami. Even shellfish have little or no saturated fat. Have a 3 oz (85 g) portion when you eat lean meat, poultry, or fish.  Trans fats are also called "partially hydrogenated oils." They are oils that have been scientifically manipulated so that they are solid at room temperature resulting in a longer shelf life and improved taste and texture of foods in which they are added. Trans fats are found in stick margarine, some tub margarines, cookies, crackers, and baked goods.   When baking and cooking, oils are a great substitute for butter. The monounsaturated oils are  especially beneficial since it is believed they lower LDL and raise HDL. The oils you should avoid entirely are saturated tropical oils, such as coconut and palm.   Remember to eat a lot from food groups that are naturally free of saturated and trans fat, including fish, fruit, vegetables, beans, grains (barley, rice, couscous, bulgur wheat), and pasta (without cream sauces).   IDENTIFYING FOODS THAT LOWER FAT AND CHOLESTEROL   Soluble fiber may lower your cholesterol. This type of fiber is found in fruits such as apples, vegetables such as broccoli, potatoes, and carrots, legumes such as beans, peas, and lentils, and grains such as barley. Foods fortified with plant sterols (phytosterol) may also lower cholesterol. You should eat at least 2 g per day of these foods for a cholesterol lowering effect.   Read package labels to identify low-saturated fats, trans fat free, and low-fat foods at the supermarket. Select cheeses that have only 2 to 3 g saturated fat per ounce. Use a heart-healthy tub margarine that is free of trans fats or partially hydrogenated oil. When buying baked goods (cookies, crackers), avoid partially hydrogenated oils. Breads and muffins should be made from whole grains (whole-wheat or whole oat flour, instead of "flour" or "enriched flour"). Buy non-creamy canned soups with reduced salt and no added fats.   FOOD PREPARATION TECHNIQUES   Never deep-fry. If you must fry, either stir-fry, which uses very little fat, or use non-stick cooking sprays. When possible, broil, bake, or roast meats, and steam vegetables. Instead of putting butter or margarine on vegetables, use lemon   and herbs, applesauce, and cinnamon (for squash and sweet potatoes). Use nonfat yogurt, salsa, and low-fat dressings for salads.   LOW-SATURATED FAT / LOW-FAT FOOD SUBSTITUTES  Meats / Saturated Fat (g)  · Avoid: Steak, marbled (3 oz/85 g) / 11 g  · Choose: Steak, lean (3 oz/85 g) / 4 g  · Avoid: Hamburger (3 oz/85 g) / 7  g  · Choose: Hamburger, lean (3 oz/85 g) / 5 g  · Avoid: Ham (3 oz/85 g) / 6 g  · Choose: Ham, lean cut (3 oz/85 g) / 2.4 g  · Avoid: Chicken, with skin, dark meat (3 oz/85 g) / 4 g  · Choose: Chicken, skin removed, dark meat (3 oz/85 g) / 2 g  · Avoid: Chicken, with skin, light meat (3 oz/85 g) / 2.5 g  · Choose: Chicken, skin removed, light meat (3 oz/85 g) / 1 g  Dairy / Saturated Fat (g)  · Avoid: Whole milk (1 cup) / 5 g  · Choose: Low-fat milk, 2% (1 cup) / 3 g  · Choose: Low-fat milk, 1% (1 cup) / 1.5 g  · Choose: Skim milk (1 cup) / 0.3 g  · Avoid: Hard cheese (1 oz/28 g) / 6 g  · Choose: Skim milk cheese (1 oz/28 g) / 2 to 3 g  · Avoid: Cottage cheese, 4% fat (1 cup) / 6.5 g  · Choose: Low-fat cottage cheese, 1% fat (1 cup) / 1.5 g  · Avoid: Ice cream (1 cup) / 9 g  · Choose: Sherbet (1 cup) / 2.5 g  · Choose: Nonfat frozen yogurt (1 cup) / 0.3 g  · Choose: Frozen fruit bar / trace  · Avoid: Whipped cream (1 tbs) / 3.5 g  · Choose: Nondairy whipped topping (1 tbs) / 1 g  Condiments / Saturated Fat (g)  · Avoid: Mayonnaise (1 tbs) / 2 g  · Choose: Low-fat mayonnaise (1 tbs) / 1 g  · Avoid: Butter (1 tbs) / 7 g  · Choose: Extra light margarine (1 tbs) / 1 g  · Avoid: Coconut oil (1 tbs) / 11.8 g  · Choose: Olive oil (1 tbs) / 1.8 g  · Choose: Corn oil (1 tbs) / 1.7 g  · Choose: Safflower oil (1 tbs) / 1.2 g  · Choose: Sunflower oil (1 tbs) / 1.4 g  · Choose: Soybean oil (1 tbs) / 2.4 g  · Choose: Canola oil (1 tbs) / 1 g  Document Released: 07/31/2005 Document Revised: 11/25/2012 Document Reviewed: 01/19/2011  [employer]® Patient Information ©2014 [employer].

## 2013-09-11 ENCOUNTER — Encounter: Payer: Self-pay | Admitting: Family Medicine

## 2013-10-08 NOTE — Progress Notes (Signed)
   Subjective:    Patient ID: Miguel Lyons, male    DOB: 06/28/1956, 58 y.o.   MRN: [id number]  HPI Pt here to establish care. He has a h/o htn with intolerance to ace-i so takes cozaar. Does well on it, has BP at goal, no side effects. Requests a refill.   He has a h/o elevated psa per his lab report that was done recentlyu at work. No sx. Will scan in document.   Feels well, does not have a lot of time today but wanted to make sure he did not run out of meds and was able to get the psa "checked out"  Review of Systems A 12 point review of systems is negative except as per hpi.       Objective:   Physical Exam Nursing note and vitals reviewed. Constitutional: He is oriented to person, place, and time. He  appears well-developed and well-nourished.  HENT:  Right Ear: External ear normal.  Left Ear: External ear normal.  Nose: Nose normal.  Mouth/Throat: Oropharynx is clear and moist. No oropharyngeal exudate.  Eyes: Conjunctivae are normal. Pupils are equal, round, and reactive to light.  Neck: Normal range of motion. Neck supple. No thyromegaly present.  Cardiovascular: Normal rate, regular rhythm and normal heart sounds.   Pulmonary/Chest: Effort normal and breath sounds normal.  Abdominal: Soft. Bowel sounds are normal.  no distension. There is no tenderness. There is no rebound.  Lymphadenopathy:    He has no cervical adenopathy.  Neurological: He is alert and oriented to person, place, and time. He has normal reflexes.  Skin: Skin is warm and dry.He has no concerning moles or skin lesions Psychiatric: He has a normal mood and affect. His behavior is normal.        Assessment & Plan:  Renae Fickleaul was seen today for new patient.  Diagnoses and associated orders for this visit:  Essential hypertension, benign - losartan (COZAAR) 50 MG tablet; Take 1 tablet (50 mg total) by mouth daily.  Elevated PSA - Ambulatory referral to Urology

## 2013-12-02 ENCOUNTER — Telehealth: Payer: Self-pay | Admitting: *Deleted

## 2013-12-02 NOTE — Telephone Encounter (Signed)
Wife called and left VM stating that they had received a letter stating that MD was leaving and wanted to know what they should do regarding a family physician, that pt needed refill on some medications. Nurse returned call, no answer, message left for callback.

## 2014-12-11 IMAGING — US US EXTREM LOW VENOUS*R*
1 series · 14 of 24 positions shown · non-contrast
Comparison: None.

CLINICAL DATA: Right leg pain, history of varicose vein stripping

 LOWER EXTREMITY VENOUS DUPLEX ULTRASOUND
TECHNIQUE: Gray-scale sonography with graded compression, as well
as color Doppler and duplex ultrasound were performed to evaluate
the deep venous system of the lower extremity from the level of the
common femoral vein through the popliteal and proximal calf veins.
Spectral Doppler was utilized to evaluate flow at rest and with
distal augmentation maneuvers.

[Series 1: us extrem low venous*right* · 0.09mm/px · 14 of 36 slices shown]
[im 1/36]
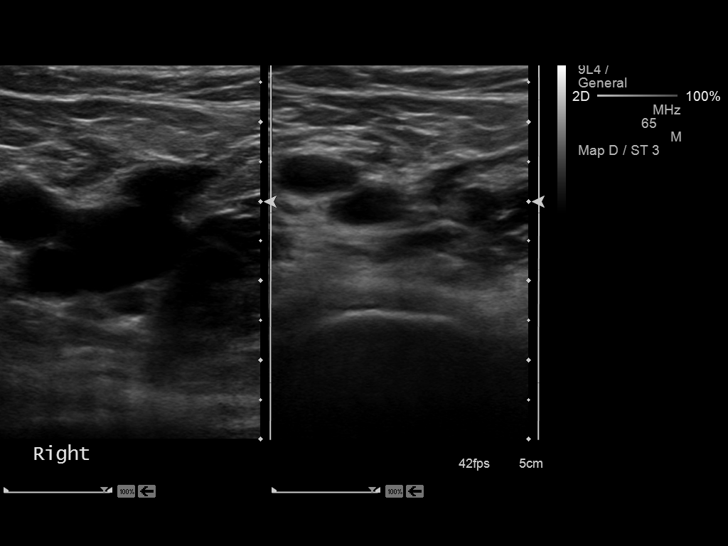
[im 4/36]
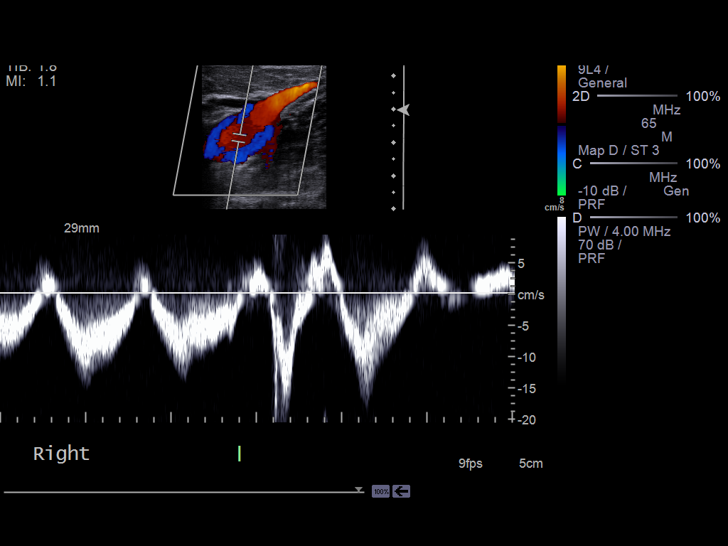
[im 7/36]
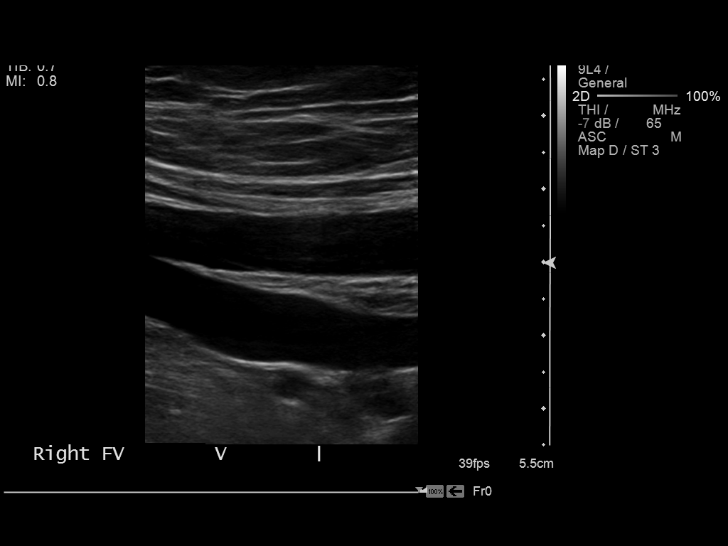
[im 10/36]
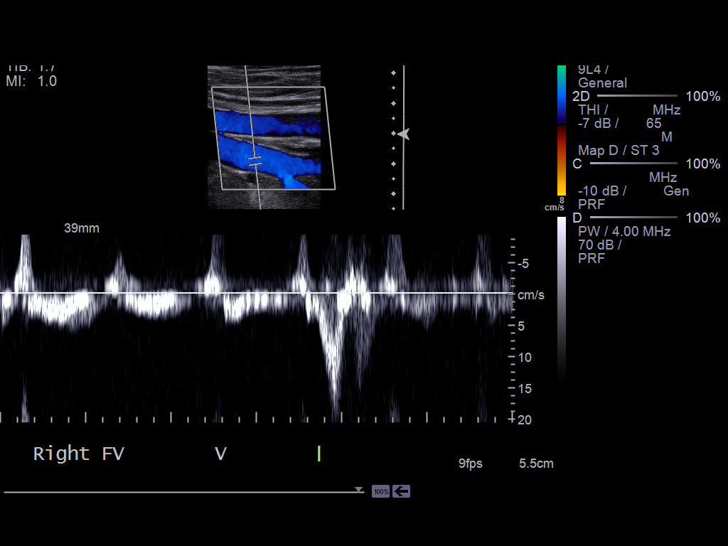
[im 11/36]
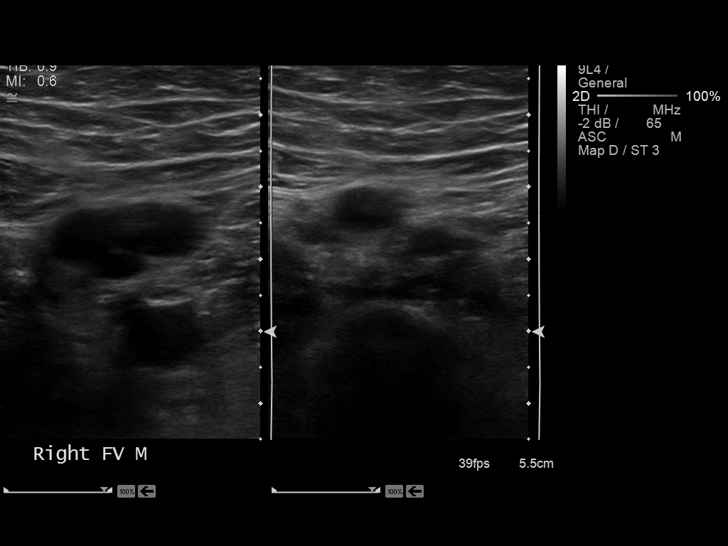
[im 14/36]
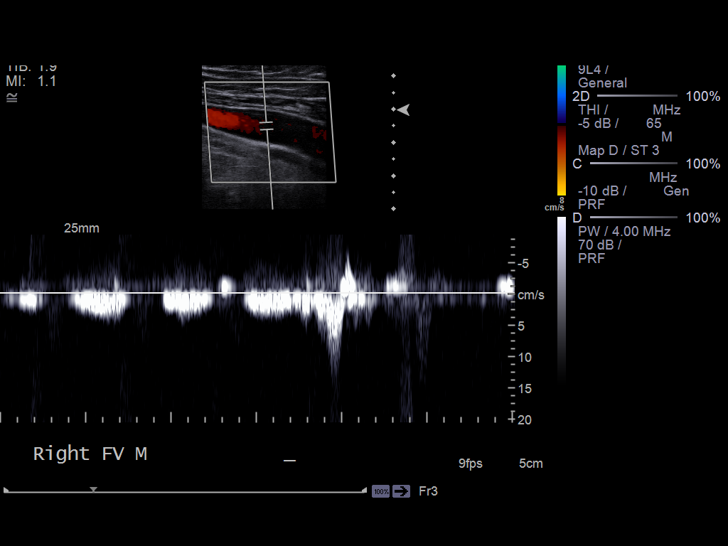
[im 17/36]
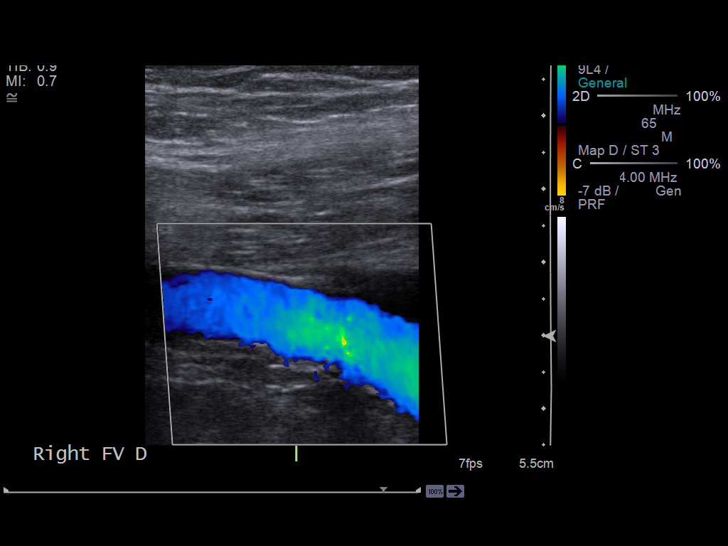
[im 19/36]
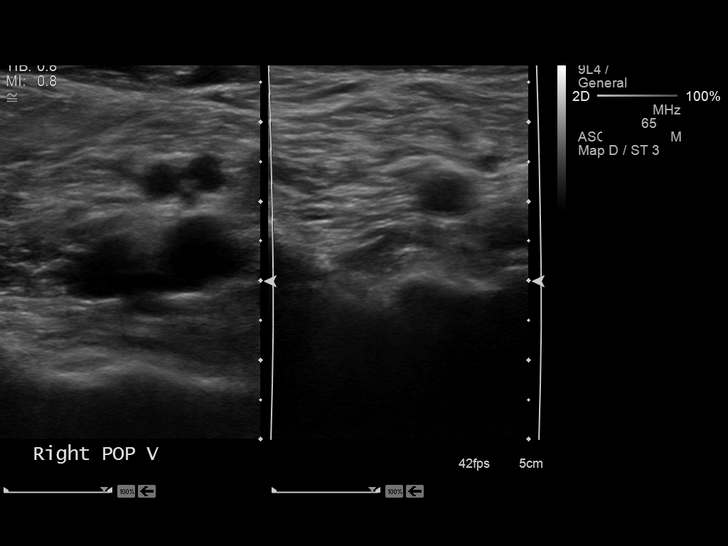
[im 22/36]
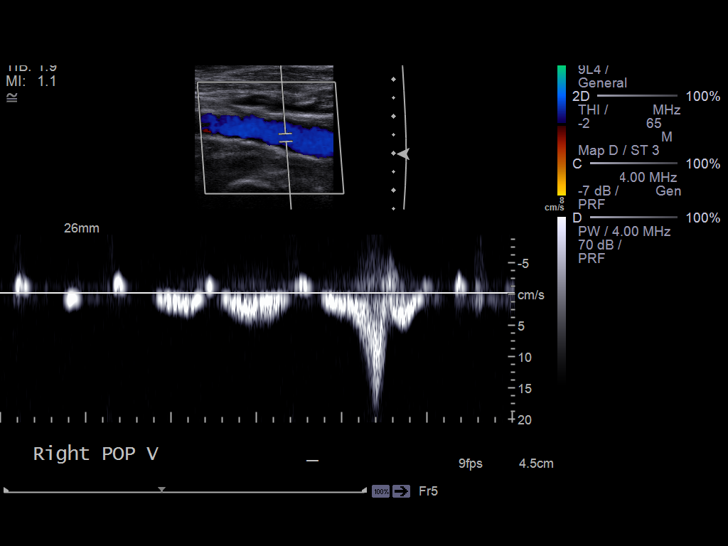
[im 25/36]
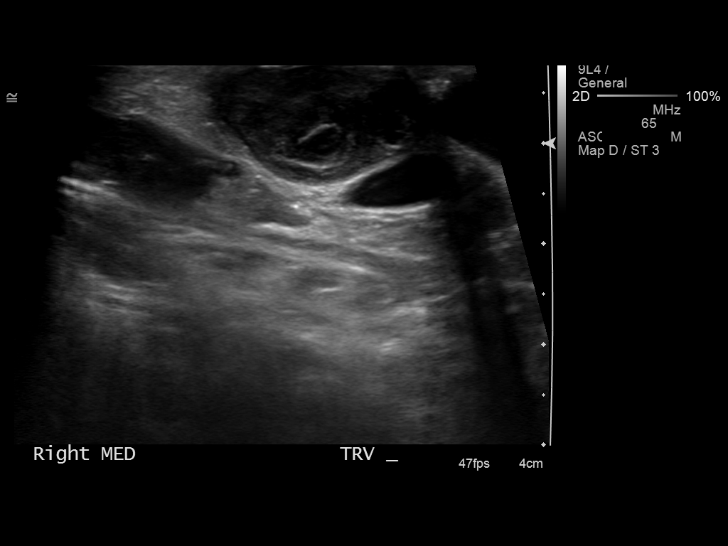
[im 28/36]
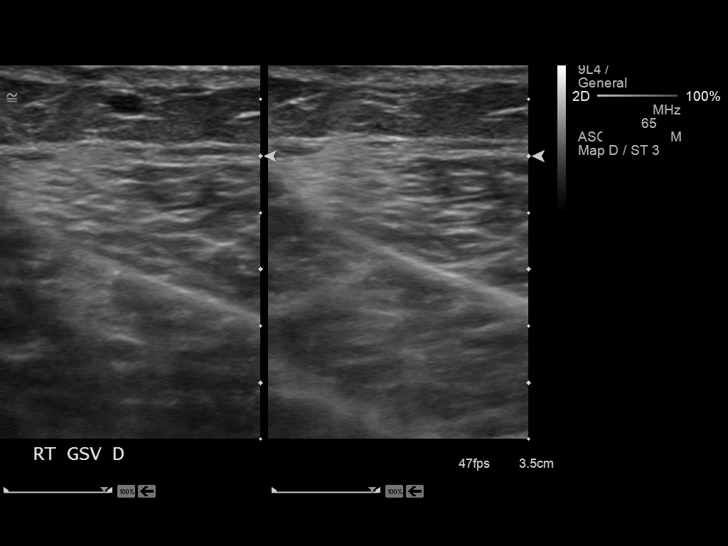
[im 29/36]
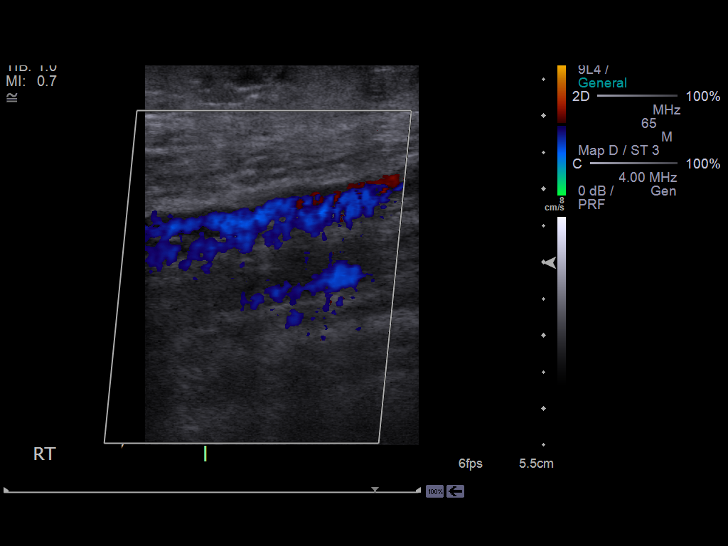
[im 32/36]
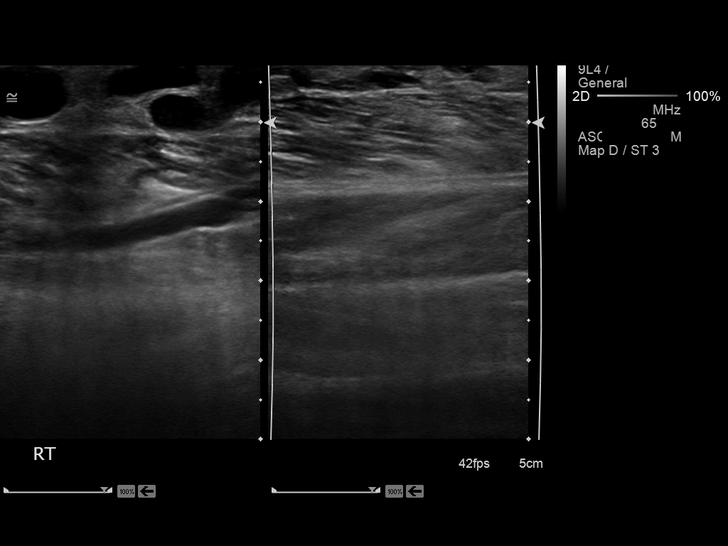
[im 36/36]
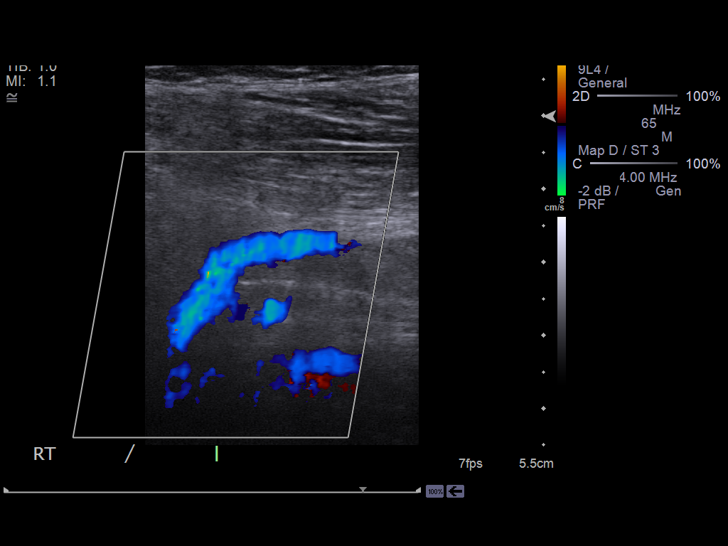

[14 of 24 positions shown; findings below may reference images not displayed]

FINDINGS: Normal compressibility of the common femoral,
superficial femoral, and popliteal veins is demonstrated, as well
as the visualized proximal calf veins.  No filling defects to
suggest DVT on grayscale or color Doppler imaging.  Doppler
waveforms show normal direction of venous flow, normal respiratory
phasicity and response to augmentation.

Multiple varicosities are noted in the region of the medial thigh
and calf, many of which have thrombus within.  There is no thrombus
at the saphenofemoral junction.
IMPRESSION: No evidence of deep venous thrombosis.  Superficial venous
thrombosis is noted within multiple varicosities within the calf
and thigh.

## 2017-06-11 DIAGNOSIS — Z6826 Body mass index (BMI) 26.0-26.9, adult: Secondary | ICD-10-CM | POA: Diagnosis not present

## 2017-06-11 DIAGNOSIS — R21 Rash and other nonspecific skin eruption: Secondary | ICD-10-CM | POA: Diagnosis not present

## 2024-07-02 ENCOUNTER — Ambulatory Visit: Payer: Self-pay | Admitting: Urology

## 2024-07-02 ENCOUNTER — Encounter: Payer: Self-pay | Admitting: Urology

## 2024-07-02 VITALS — BP 170/83 | HR 63

## 2024-07-02 DIAGNOSIS — R972 Elevated prostate specific antigen [PSA]: Secondary | ICD-10-CM

## 2024-07-02 LAB — URINALYSIS, ROUTINE W REFLEX MICROSCOPIC
Bilirubin, UA: NEGATIVE
Glucose, UA: NEGATIVE
Ketones, UA: NEGATIVE
Nitrite, UA: NEGATIVE
RBC, UA: NEGATIVE
Specific Gravity, UA: 1.015 (ref 1.005–1.030)
Urobilinogen, Ur: 1 mg/dL (ref 0.2–1.0)
pH, UA: 6.5 (ref 5.0–7.5)

## 2024-07-02 LAB — MICROSCOPIC EXAMINATION: Bacteria, UA: NONE SEEN

## 2024-07-02 NOTE — Patient Instructions (Signed)

## 2024-07-02 NOTE — Progress Notes (Signed)
 07/02/2024 11:04 AM   Miguel Lyons August 14, 1956 [id number]  Referring provider: Toribio Jerel MATSU, MD [address] [place],  [place] [address]  Elevated PSA   HPI: Miguel Lyons is a 68yo here for evaluation of elevated PSA. He was previously seen by Dr. Beola who wanted to treat him for prostatitis 10 years ago. He was then seen at [facility] 2 years ago or elevated PSA. PSA at that time was 7.1. His PSa then decreased to 5.5 and then 5.1 here recently. IPSS 11 QOL 2 on no BPh medication. Nocturia 3x. No prior prostate biopsy. No family hx of prostate cancer.    PMH: Past Medical History:  Diagnosis Date   Hyperlipidemia    Hypertension    Varicose veins     Surgical History: Past Surgical History:  Procedure Laterality Date   VARICOSE VEIN SURGERY     varicose veins      Home Medications:  Allergies as of 07/02/2024   No Known Allergies      Medication List        Accurate as of July 02, 2024 11:04 AM. If you have any questions, ask your nurse or doctor.          aspirin 81 MG tablet Take 81 mg by mouth daily.   losartan  50 MG tablet Commonly known as: COZAAR  Take 1 tablet (50 mg total) by mouth daily.        Allergies: No Known Allergies  Family History: Family History  Problem Relation Age of Onset   Other Mother        varicose veins   Cancer Father     Social History:  reports that he has never smoked. He has never used smokeless tobacco. He reports current alcohol use. He reports that he does not use drugs.  ROS: All other review of systems were reviewed and are negative except what is noted above in HPI  Physical Exam: BP (!) 170/83   Pulse 63   Constitutional:  Alert and oriented, No acute distress. HEENT: [place] AT, moist mucus membranes.  Trachea midline, no masses. Cardiovascular: No clubbing, cyanosis, or edema. Respiratory: Normal respiratory effort, no increased work of breathing. GI: Abdomen is soft, nontender, nondistended,  no abdominal masses GU: No CVA tenderness. Circumcised phallus. No masses/lesions on penis, testis, scrotum. Prostate 60g smooth no nodules no induration.  Lymph: No cervical or inguinal lymphadenopathy. Skin: No rashes, bruises or suspicious lesions. Neurologic: Grossly intact, no focal deficits, moving all 4 extremities. Psychiatric: Normal mood and affect.  Laboratory Data: No results found for: WBC, HGB, HCT, MCV, PLT  No results found for: CREATININE  No results found for: PSA  No results found for: TESTOSTERONE  No results found for: HGBA1C  Urinalysis No results found for: COLORURINE, APPEARANCEUR, LABSPEC, PHURINE, GLUCOSEU, HGBUR, BILIRUBINUR, KETONESUR, PROTEINUR, UROBILINOGEN, NITRITE, LEUKOCYTESUR  No results found for: LABMICR, WBCUA, RBCUA, LABEPIT, MUCUS, BACTERIA  Pertinent Imaging:  No results found for this or any previous visit.  No results found for this or any previous visit.  No results found for this or any previous visit.  No results found for this or any previous visit.  No results found for this or any previous visit.  No results found for this or any previous visit.  No results found for this or any previous visit.  No results found for this or any previous visit.   Assessment & Plan:    1. Elevated PSA (Primary) The patient and  I talked about etiologies of elevated PSA.  We discussed the possible relationship between elevated PSA and prostate cancer, BPH, prostatitis, infection trauma and recent ejaculations.  The patient's PSA has been relatively stable over the interval and as such I recommended that we follow-up with a repeat PSA in 6 months.  If it remains elevated with a positive rising trend we will discuss prostate biopsy at his follow-up appointment.  - Urinalysis, Routine w reflex microscopic   No follow-ups on file.  Belvie Clara, MD  [facility] [place]

## 2024-12-31 ENCOUNTER — Ambulatory Visit: Admitting: Urology
